# Patient Record
Sex: Female | Born: 1985 | Race: White | Marital: Married | Smoking: Never smoker
Health system: Northeastern US, Academic
[De-identification: ages and names within clinical notes are randomized; demographics above are authoritative.]

## PROBLEM LIST (undated history)

## (undated) DIAGNOSIS — F988 Other specified behavioral and emotional disorders with onset usually occurring in childhood and adolescence: Secondary | ICD-10-CM

## (undated) DIAGNOSIS — F419 Anxiety disorder, unspecified: Secondary | ICD-10-CM

## (undated) DIAGNOSIS — J45909 Unspecified asthma, uncomplicated: Secondary | ICD-10-CM

## (undated) HISTORY — DX: Other specified behavioral and emotional disorders with onset usually occurring in childhood and adolescence: F98.8

## (undated) HISTORY — DX: Anxiety disorder, unspecified: F41.9

## (undated) HISTORY — DX: Unspecified asthma, uncomplicated: J45.909

---

## 2009-02-28 ENCOUNTER — Ambulatory Visit
Admit: 2009-02-28 | Discharge: 2009-02-28 | Disposition: A | Discharge status: Home / Self Care | Payer: Self-pay | Source: Ambulatory Visit | Attending: Internal Medicine | Admitting: Internal Medicine

## 2009-03-02 LAB — AEROBIC CULTURE: Aerobic Culture: NO GROWTH

## 2010-07-18 ENCOUNTER — Encounter: Payer: Self-pay | Admitting: Gastroenterology

## 2010-07-18 ENCOUNTER — Encounter: Payer: Self-pay | Admitting: Dermatology

## 2010-07-18 ENCOUNTER — Ambulatory Visit: Payer: Self-pay | Admitting: Dermatology

## 2010-07-18 NOTE — Progress Notes (Addendum)
 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    She states that she has had a mole on her right upper chest for many years   but several months ago with half ripped off when she woke up in her sleep   and covered in blood.  It has grown back since that time and has been   extremely painful on a daily basis.  Her mother has a history of multiple   moles requiring removal but no history of melanoma.  She has no personal   history of skin cancer.     ROS:   Otherwise well.   --No other skin concerns      Past Medical/Surgical History:  Intake form reviewed; asthma, environmental   allergies, depression, wisdom teeth removal     Family History: No family history of melanoma; see history of present   illness; Intake form reviewed        Social History:    Intake form reviewed; she drink alcohol several times   per week.  She does not use recreational drugs.  She does not smoke.        Exam:    --Well-nourished, well-developed, no acute distress  --All of the following were examined and were within normal limits, except   as noted:     -- Face, Neck, Scalp:   -- Chest, Back, Abdomen:      -- Buttocks, Genitalia:     --Right/Left Arms/Hands:    --Right/Left Legs/Feet:   -- Hair, Nails, Oral Mucosa, Eyes:    -On the right chest, there is a 5 mm pink brown soft dome-shaped papule.    Scattered on the back and upper extremities there is an occasional evenly   pigmented, well-demarcated light to medium brown macule 2-4 mm in diameter.  Barriers to learning:  None      Assessment/Plan:   1.  Symptomatic dermal nevus  -She is reassured of benign nature.  Because it is significantly painful,   she would like definitive treatment.  -Punch biopsy of right chest.  Informed consent obtained.  1% lidocaine   with epinephrine used for anesthesia.  A 5 mm punch used.  3 sutures of 5-0   Prolene placed.  Dressed with antibiotic  ointment and a Band-Aid.  Wound   care discussed.  Return in 7 days for suture removal.  -We'll discuss biopsy results at suture removal.  Vital Signs   Recorded by [facility] on 18 Jul 2010 12:56 PM  Pain Scale: 2.5.  Allergies   Aspirin TABS  Ibuprofen IB TABS  Latex-asked/denied.  Attestation          I saw and evaluated the patient.  I agree with the resident's findings and   plan of care as documented above.  I was present for key and critical   portions of the procedure and immediately available  Wynonia Lawman, 07/25/2010,  8:31AM.  Signature   Electronically signed by: Elmon Else  MD - Res.; 07/18/2010 1:35 PM   EST.  Electronically signed by: Wynonia Lawman  M.D.; 07/25/2010 8:32 AM EST.

## 2010-07-18 NOTE — Miscellaneous (Unsigned)
 Continuity of Care Record  Created: todo  From: Retta Pitcher, Lavida Patch  From:   From: [employer], EHR v10.2.7.53  To: Iversen, Thereasa  Purpose: Patient Use;       Alerts  Allergy - Aspirin TABS   Allergy - Ibuprofen IB TABS   Allergy - Latex-asked/denied

## 2010-07-20 LAB — SURGICAL PATHOLOGY

## 2010-07-25 ENCOUNTER — Ambulatory Visit: Payer: Self-pay | Admitting: Dermatology

## 2010-07-25 ENCOUNTER — Encounter: Payer: Self-pay | Admitting: Dermatology

## 2010-08-08 ENCOUNTER — Ambulatory Visit: Payer: Self-pay | Admitting: Dermatology

## 2010-08-08 NOTE — Progress Notes (Signed)
 Progress Note   CC:  suture removal, rash  HPI:  the patient is a 25 year old Caucasian female who is here for removal   of sutures one week after biopsy of a lesion on the right chest.  She has   done well since the procedure.  She has developed a rash around the biopsy   site.     ROS:   Otherwise well.   --No other skin concerns    --New Skin Lesions/Rash   --see HPI    --Itching        Exam:    --Well-nourished, well-developed, no acute distress  --All of the following were examined and were within normal limits, except   as noted:     -- Face, Neck, Scalp:   -- Chest, Back, Abdomen:   on the right chest is a well approximated biopsy   site with Prolene sutures in place.  Distributed in a geometric   configuration surrounding the biopsy site is a red dermatitic thin plaque.      Barriers to learning:  None      Assessment/Plan:  1.  Allergic contact dermatitis to adhesive from the   bandage.  Diagnosis discussed.  Samples were provided of Topicort 0.25%   ointment twice daily use until clear.  2.  Suture removal status post biopsy of dermal nevus.  Pathology results   reviewed.  No further treatment is necessary.  Prolene sutures are removed.  3.  Return to clinic as needed.  Signature   Electronically signed by: Wynonia Lawman  M.D.; 08/08/2010 9:13 AM EST.

## 2010-08-22 ENCOUNTER — Ambulatory Visit
Admit: 2010-08-22 | Discharge: 2010-08-22 | Disposition: A | Discharge status: Home / Self Care | Payer: Self-pay | Source: Ambulatory Visit | Attending: Internal Medicine | Admitting: Internal Medicine

## 2010-08-23 LAB — VAGINITIS SCREEN: DNA PROBE: Vaginitis Screen:DNA Probe: 0

## 2010-08-24 LAB — AEROBIC CULTURE

## 2010-08-31 ENCOUNTER — Telehealth: Payer: Self-pay

## 2010-08-31 ENCOUNTER — Ambulatory Visit
Admit: 2010-08-31 | Discharge: 2010-08-31 | Disposition: A | Discharge status: Home / Self Care | Payer: Self-pay | Source: Ambulatory Visit | Attending: Internal Medicine | Admitting: Internal Medicine

## 2010-09-01 LAB — AEROBIC CULTURE: Aerobic Culture: 0

## 2010-10-11 ENCOUNTER — Ambulatory Visit
Admit: 2010-10-11 | Discharge: 2010-10-11 | Disposition: A | Discharge status: Home / Self Care | Payer: Self-pay | Source: Ambulatory Visit | Attending: Nurse Practitioner | Admitting: Nurse Practitioner

## 2010-10-13 LAB — HIV-1 AND 2 AB: HIV 1&2 Ab screen: NEGATIVE

## 2010-10-13 LAB — SYPHILIS SCREEN
Syphilis Screen: NEGATIVE
Syphilis Status: NONREACTIVE

## 2010-10-13 LAB — CHLAMYDIA PLASMID DNA AMPLIFICATION: Chlamydia Plasmid DNA Amplification: 0

## 2010-10-14 LAB — GYN CYTOLOGY

## 2010-10-15 LAB — GC CULTURE: GC Culture: 0

## 2010-12-16 ENCOUNTER — Telehealth: Payer: Self-pay

## 2010-12-16 NOTE — Telephone Encounter (Signed)
Gave to billing

## 2010-12-16 NOTE — Telephone Encounter (Signed)
Mrs. Body the patient's mother is requesting a callback.  She would like to speak with someone about a co-pay (99.00) for lab fees for a biopsy. The patient did not request this procedure and feel she should not be responsible for the bill. Mrs. Mleczko spoke with [facility] billing and they could not help her. Please call [phone]  Or  [phone].

## 2010-12-16 NOTE — Telephone Encounter (Signed)
Patient's mother Robin Johnston  is calling again.  She is very frustrated about this charge as the patient had been informed that she would only have been charged to remove the mole.  Robin Johnston requested for Dr. Marca Ancona to call [name] about this as she has been back and forth so much with our office and billing, she wants to speak with Dr. Marca Ancona directly.  Please call patient at [phone].

## 2010-12-20 NOTE — Telephone Encounter (Signed)
Returned patient's phone call today.  Sounds like she got a denial notice from her insurance company for the lab portion of the biopsy on her chest as being not medically necessary.  I explained that while removal of benign nevi is sometimes not medically necessary, in this case it is well-documented that her symptoms (pain, bleeding) will fulfill the criteria for medical necessity.  Advised her that she needs to file an appeal with her insurance company to cover those charges.

## 2012-02-08 ENCOUNTER — Ambulatory Visit
Admit: 2012-02-08 | Discharge: 2012-02-08 | Disposition: A | Discharge status: Home / Self Care | Payer: Self-pay | Source: Ambulatory Visit | Attending: Internal Medicine | Admitting: Internal Medicine

## 2012-02-08 LAB — COMPREHENSIVE METABOLIC PANEL
ALT: 20 U/L (ref 0–35)
AST: 28 U/L (ref 0–35)
Albumin: 4.7 g/dL (ref 3.5–5.2)
Alk Phos: 59 U/L (ref 35–105)
Anion Gap: 11 (ref 7–16)
Bilirubin,Total: 0.5 mg/dL (ref 0.0–1.2)
CO2: 26 mmol/L (ref 20–28)
Calcium: 9.1 mg/dL (ref 8.8–10.2)
Chloride: 105 mmol/L (ref 96–108)
Creatinine: 0.73 mg/dL (ref 0.51–0.95)
GFR,Black: 132 *
GFR,Caucasian: 115 *
Glucose: 71 mg/dL (ref 60–99)
Lab: 9 mg/dL (ref 6–20)
Potassium: 4.2 mmol/L (ref 3.3–5.1)
Sodium: 142 mmol/L (ref 133–145)
Total Protein: 6.7 g/dL (ref 6.3–7.7)

## 2012-02-08 LAB — CBC AND DIFFERENTIAL
Baso # K/uL: 0 10*3/uL (ref 0.0–0.1)
Basophil %: 0.4 % (ref 0.1–1.2)
Eos # K/uL: 0.5 10*3/uL — ABNORMAL HIGH (ref 0.0–0.4)
Eosinophil %: 6.8 % — ABNORMAL HIGH (ref 0.7–5.8)
Hematocrit: 40 % (ref 34–45)
Hemoglobin: 13.4 g/dL (ref 11.2–15.7)
Lymph # K/uL: 2.4 10*3/uL (ref 1.2–3.7)
Lymphocyte %: 33.6 % (ref 19.3–51.7)
MCV: 92 fL (ref 79–95)
Mono # K/uL: 0.6 10*3/uL (ref 0.2–0.9)
Monocyte %: 7.9 % (ref 4.7–12.5)
Neut # K/uL: 3.7 10*3/uL (ref 1.6–6.1)
Platelets: 173 10*3/uL (ref 160–370)
RBC: 4.3 MIL/uL (ref 3.9–5.2)
RDW: 13.9 % (ref 11.7–14.4)
Seg Neut %: 51.3 % (ref 34.0–71.1)
WBC: 7.2 10*3/uL (ref 4.0–10.0)

## 2012-02-08 LAB — SEDIMENTATION RATE, AUTOMATED: Sedimentation Rate: 7 mm/hr (ref 0–20)

## 2012-02-08 LAB — TSH: TSH: 1.02 u[IU]/mL (ref 0.27–4.20)

## 2012-02-09 LAB — TISSUE TRANSGLUT,IGA
IgA: 115 mg/dL (ref 70–400)
tTG,IgA: 3.6 AB/Units (ref 0.0–19.9)

## 2012-02-09 LAB — ANTI-SSA/SSB
Anti-LA/SS-B: <0.2 AI (ref 0.0–0.9)
Anti-RO/SS-A: <0.2 AI (ref 0.0–0.9)

## 2012-02-09 LAB — RHEUMATOID FACTOR,SCREEN: Rheumatoid Factor: <10 IU/mL

## 2012-02-09 LAB — ANTINUCLEAR ANTIBODY SCREEN: ANA Screen: NEGATIVE

## 2012-06-07 NOTE — Telephone Encounter (Signed)
Was told by my supervisor Diana to close all encounters that are over 6 months old.

## 2015-07-29 ENCOUNTER — Ambulatory Visit: Payer: Self-pay | Admitting: Family Medicine

## 2015-08-05 ENCOUNTER — Encounter: Payer: Self-pay | Admitting: Family Medicine

## 2015-08-05 ENCOUNTER — Ambulatory Visit: Payer: Self-pay | Admitting: Family Medicine

## 2015-08-05 ENCOUNTER — Other Ambulatory Visit
Admission: RE | Admit: 2015-08-05 | Discharge: 2015-08-05 | Disposition: A | Discharge status: Home / Self Care | Payer: Self-pay | Source: Ambulatory Visit | Attending: Family Medicine | Admitting: Family Medicine

## 2015-08-05 VITALS — BP 98/59 | Ht 69.75 in | Wt 158.0 lb

## 2015-08-05 DIAGNOSIS — Z30432 Encounter for removal of intrauterine contraceptive device: Secondary | ICD-10-CM

## 2015-08-05 DIAGNOSIS — Z113 Encounter for screening for infections with a predominantly sexual mode of transmission: Secondary | ICD-10-CM

## 2015-08-05 DIAGNOSIS — Z3009 Encounter for other general counseling and advice on contraception: Secondary | ICD-10-CM

## 2015-08-05 LAB — POCT URINE PREGNANCY: Lot #: 162941

## 2015-08-05 NOTE — Progress Notes (Signed)
Subjective:     Robin Johnston is a 30 y.o. female  who presents for first appointment to [facility]. she  wishes to discuss contraceptive options. Other acute concerns include - denies.  The patient is sexually active with her husband. Pertinent past medical history entered in the chart.  Contraceptive history: migraines. No past STD history.     Current contraception: IUD  Side effects: no, likes it and want another one  Condom [id number] currently since Mirena is expired  Plan B: has used before  Past Contraception: OCP (estrogen/progesterone) and IUD  Reason for discontinuation:  ParaGard caused heavy bleeding, Stopped Yaz to get the IUD    Menstrual History:  OB History     Gravida Para Term Preterm AB TAB SAB Ectopic Multiple Living            Menarche age: 58  Patient's last menstrual period was 07/26/2015 (exact date).       Patient's medications, allergies, past medical, surgical, social and family histories were reviewed and updated as appropriate.    Review of Systems  Health history intake form reviewed with patient in detail.    Pertinent items are noted in HPI.  Immunizations reviewed Yes  Patient has advanced directives in place: no  Discussed the following with client:  Domestic violence: denies   Partner Coercion: denies  Client lives with: Husband .    Objective:     Visit Vitals    BP 98/59 (BP Location: Left arm, Patient Position: Sitting, Cuff Size: adult)    Ht 1.772 m (5' 9.75")    Wt 71.7 kg (158 lb)    LMP 07/26/2015 (Exact Date)    BMI 22.83 kg/m2      General appearance: alert, appears stated age and no distress  Head: Normocephalic, without obvious abnormality, atraumatic  Pelvic: cervix normal in appearance, external genitalia normal, no adnexal masses or tenderness, no cervical motion tenderness, uterus normal size, shape, and consistency and vagina normal without discharge  Extremities: extremities normal, atraumatic, no cyanosis or edema  + mood, +  affect    Assessment:     Contraception counseling  STD screening    Plan:     All questions answered.  Chlamydia specimen.  Contraception: condoms: 100% of the time and returning for [facility] insertion - unable to place it today. Could not sound the uterus after removal of her Mirena..  Educational material distributed.  Follow up in 2 weeks.  GC specimen.  Pregnancy test, result: negative.  Vaginitis swab sent  Verbal and written information provided to the patient today regarding Her chosen form of contraception.   Contraception consent signed and placed in chart: yes  Consent for routine health care signed:  yes  Notification of privacy signed and given for scanning into the chart:  yes  Condoms dispensed: declined  Plan B dispensed:declined  Immunizations reviewed Yes  PCP Referral not indicated   Patient given Advanced Directives packet: yes  Follow up in 2 weeks     IUD Removal Note    Robin Johnston is a 30 y.o. female, G0P0000 with a last period of 07/26/2015.    Her current method of birth control is Mirena and prior methods include: OCP (estrogen/progesterone).     Patient requests IUD removal due to expiration of the device.    UPT result: negative     Consent:  The procedure risks, benefits, complications and possible alternatives were discussed with  the patient. Patient understands that she may become pregnant after removal. All questions were answered prior to the patient signing the informed consent.    Pre-Procedural Time Out:  08/05/2015                                         4:26 PM    Correct Procedure: Yes  Correct Patient: (use 2 Identifiers) Yes  Correct Site: Yes  Site marked: Yes  Correct Patient Position: Yes  Consent Verified: Yes  Appropriate Hand Hygiene Used: Yes  List Any Participants Involved in Time-Out : patient, Consuella LoseAllison Hartfiel, LPN, Otilio JeffersonBETHANY L MERKLINGER, NP  Availability of correct implants and any special equipment: Yes    Procedure Details       The patient was placed in the  dorsal lithotomy position.  Bimanual exam showed the uterus to be in  a retroverted position.  A speculum was inserted into the vagina, and the IUD strings were visualized and a sponge forceps was used to grasp stings.  The IUD was removed and noted to be intact on inspection.  Bleeding was not noted.  The patient tolerated the procedure well. Blood loss was minimal.    Post procedure pain rated as moderate cramping       Assessment:   Mirena IUD removal at patient request.     Plan:  Post removal instructions were reviewed with the patient and written information was also provided.    The plan was reviewed with the patient including:  - Monitoring for heavy bleeding, fever or severe pain.  - The patient will follow up in 2 weeks for a Kyleena placement - unable to pass sound today - will premedicate with Misoprostol   - Post IUD removal patient will use condoms: 100% of the time for contraception. Returning for [employer].       All questions were answered and the patient stated a good understanding of instructions.

## 2015-08-06 LAB — VAGINITIS SCREEN: DNA PROBE: Vaginitis Screen:DNA Probe: 0

## 2015-08-06 LAB — N. GONORRHOEAE DNA AMPLIFICATION: N. gonorrhoeae DNA Amplification: 0

## 2015-08-06 LAB — CHLAMYDIA PLASMID DNA AMPLIFICATION: Chlamydia Plasmid DNA Amplification: 0

## 2015-08-16 ENCOUNTER — Ambulatory Visit: Payer: Medicaid (Managed Care) | Admitting: Family Medicine

## 2015-08-22 ENCOUNTER — Encounter: Payer: Self-pay | Admitting: Family Medicine

## 2015-09-01 ENCOUNTER — Other Ambulatory Visit: Payer: Self-pay | Admitting: Family Medicine

## 2015-09-01 MED ORDER — MISOPROSTOL 200 MCG PO TABS *I*
ORAL_TABLET | ORAL | 0 refills | Status: DC
Start: 2015-09-01 — End: 2015-09-02

## 2015-09-02 ENCOUNTER — Ambulatory Visit: Payer: Medicaid (Managed Care) | Attending: Family Medicine | Admitting: Family Medicine

## 2015-09-02 ENCOUNTER — Encounter: Payer: Self-pay | Admitting: Family Medicine

## 2015-09-02 VITALS — BP 109/68 | Wt 158.0 lb

## 2015-09-02 DIAGNOSIS — Z538 Procedure and treatment not carried out for other reasons: Secondary | ICD-10-CM

## 2015-09-02 LAB — POCT URINE PREGNANCY: Lot #: 170601

## 2015-09-02 NOTE — Progress Notes (Signed)
IUD Insertion Note    Robin Johnston is a 30 y.o. female, G0P0000 with a last period of 07/26/2015. She took Misoprostol 400 MCG buccally 2 hours prior to visit.     Her current method of birth control is abstinence and prior methods include: condoms: 50% of the time and IUD: Mirena. She states her last intercourse was prior to Mirena removal.  Patient now requests insertion of Kyleena IUD for contraception.    GC/CT done on 08/05/2015  Results: GC: negative, CT: negative    UPT result: Negative    Consent:  The procedure risks, benefits, complications and possible alternatives were discussed with the patient. All questions were answered prior to the patient signing the informed consent.    Pre-Procedural Time Out:  09/02/2015                                         9:33 AM    Correct Procedure: Yes  Correct Patient: (use 2 Identifiers) Yes  Correct Site: Yes  Site marked: N/A  Correct Patient Position: Yes  Appropriate Hand Hygiene Used: Yes  List Any Participants Involved in Time-Out: patient, Robin LoseAllison Hartfiel, LPN, Otilio JeffersonBETHANY L MERKLINGER, NP  Availability of correct implants and any special equipment: Yes    Procedure Details     The patient was placed in the dorsal lithotomy position.  Bimanual exam showed the uterus to be in  a retroverted position.  A speculum was inserted into the vagina, and the cervix prepped with povidone iodine.  A single tooth tenaculum was placed on the anterior lip of the cervix at 11 & 1o'clock. The uterus was unable to be sounded due to inability to get through the internal OS. The tenaculum was removed without incident. The patient tolerated the procedure well. Blood loss was minimal.        Assessment:   Failed IUD insertion    Plan:  Patient referred to GYN provider that can due cervical dilation for IUD insertion.     All questions were answered and the patient stated a good understanding of instructions.

## 2016-02-09 ENCOUNTER — Other Ambulatory Visit: Payer: Self-pay | Admitting: Family Medicine

## 2016-02-10 ENCOUNTER — Other Ambulatory Visit: Payer: Self-pay | Admitting: Gastroenterology

## 2016-02-10 ENCOUNTER — Inpatient Hospital Stay: Admit: 2016-02-10 | Discharge: 2016-02-10 | Disposition: A | Discharge status: Home / Self Care | Payer: Self-pay

## 2016-03-29 ENCOUNTER — Encounter: Payer: Self-pay | Admitting: Physical Medicine and Rehabilitation

## 2016-03-29 ENCOUNTER — Ambulatory Visit: Payer: Self-pay | Admitting: Physical Medicine and Rehabilitation

## 2016-03-29 VITALS — BP 113/64 | Ht 69.75 in | Wt 150.0 lb

## 2016-03-29 DIAGNOSIS — M79601 Pain in right arm: Secondary | ICD-10-CM

## 2016-03-29 DIAGNOSIS — M542 Cervicalgia: Secondary | ICD-10-CM
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
## 2016-03-30 ENCOUNTER — Other Ambulatory Visit: Payer: Self-pay

## 2016-04-18 ENCOUNTER — Encounter: Payer: Self-pay | Admitting: Physical Medicine and Rehabilitation

## 2016-04-18 ENCOUNTER — Ambulatory Visit: Payer: Self-pay | Admitting: Physical Medicine and Rehabilitation

## 2016-04-18 VITALS — BP 138/78 | HR 96 | Ht 69.75 in | Wt 163.0 lb

## 2016-04-18 DIAGNOSIS — M542 Cervicalgia: Secondary | ICD-10-CM

## 2016-04-18 DIAGNOSIS — S161XXD Strain of muscle, fascia and tendon at neck level, subsequent encounter: Secondary | ICD-10-CM
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
## 2016-05-01 ENCOUNTER — Ambulatory Visit: Payer: Self-pay | Admitting: Rehabilitative and Restorative Service Providers"

## 2016-05-02 ENCOUNTER — Telehealth: Payer: Self-pay

## 2016-05-02 NOTE — Telephone Encounter (Signed)
CALLED PATIENT AND RESCHEDULED MISSED PT APPOINTMENT.

## 2016-05-03 ENCOUNTER — Ambulatory Visit: Payer: Self-pay

## 2016-05-03 DIAGNOSIS — M25311 Other instability, right shoulder: Secondary | ICD-10-CM

## 2016-05-03 DIAGNOSIS — S161XXD Strain of muscle, fascia and tendon at neck level, subsequent encounter: Secondary | ICD-10-CM
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
## 2016-05-12 ENCOUNTER — Ambulatory Visit: Payer: Self-pay

## 2016-05-15 ENCOUNTER — Encounter: Payer: Self-pay | Admitting: Orthopedic Surgery

## 2016-05-15 ENCOUNTER — Ambulatory Visit: Payer: Self-pay | Admitting: Orthopedic Surgery

## 2016-05-19 ENCOUNTER — Ambulatory Visit: Payer: Self-pay

## 2016-05-19 DIAGNOSIS — S161XXD Strain of muscle, fascia and tendon at neck level, subsequent encounter: Secondary | ICD-10-CM

## 2016-05-19 DIAGNOSIS — M25311 Other instability, right shoulder: Secondary | ICD-10-CM
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
## 2016-05-19 NOTE — Progress Notes (Signed)
SPORTS PT CHARGE:   Supervised Modalities:  PT CODE 200 - CPT CODE PT - 97010 - Hot / Cold Packs  Therapeutic Exercises:  PT Code 314 - CPT Code PT - 97110 - Therapeutic Exercise (23 - 37 min) x 2 units  Total Minute Time:  45 min

## 2016-05-26 ENCOUNTER — Ambulatory Visit: Payer: Self-pay

## 2016-05-26 DIAGNOSIS — S161XXD Strain of muscle, fascia and tendon at neck level, subsequent encounter: Secondary | ICD-10-CM

## 2016-05-26 DIAGNOSIS — M25311 Other instability, right shoulder: Secondary | ICD-10-CM
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
## 2016-05-27 NOTE — Progress Notes (Signed)
SPORTS PT CHARGE:   Supervised Modalities:  PT CODE 200 - CPT CODE PT - 97010 - Hot / Cold Packs  Therapeutic Exercises:  PT Code 314 - CPT Code PT - 97110 - Therapeutic Exercise (23 - 37 min) x 2 units  Total Minute Time:  45 min

## 2016-06-01 ENCOUNTER — Ambulatory Visit: Payer: Self-pay

## 2016-06-01 DIAGNOSIS — M25311 Other instability, right shoulder: Secondary | ICD-10-CM
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
## 2016-06-08 ENCOUNTER — Ambulatory Visit: Payer: Self-pay

## 2016-06-08 DIAGNOSIS — M25311 Other instability, right shoulder: Secondary | ICD-10-CM
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
## 2016-06-15 ENCOUNTER — Ambulatory Visit: Payer: Auto Insurance (includes no fault)

## 2016-06-22 ENCOUNTER — Ambulatory Visit: Payer: Auto Insurance (includes no fault) | Attending: Physical Medicine and Rehabilitation

## 2016-06-22 DIAGNOSIS — M25311 Other instability, right shoulder: Secondary | ICD-10-CM
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
## 2016-06-29 ENCOUNTER — Ambulatory Visit: Payer: Auto Insurance (includes no fault)

## 2016-06-29 DIAGNOSIS — M25311 Other instability, right shoulder: Secondary | ICD-10-CM
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
## 2016-07-13 ENCOUNTER — Ambulatory Visit: Payer: Auto Insurance (includes no fault) | Attending: Physical Medicine and Rehabilitation

## 2016-07-13 DIAGNOSIS — M25311 Other instability, right shoulder: Secondary | ICD-10-CM | POA: Insufficient documentation
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
## 2016-07-18 ENCOUNTER — Ambulatory Visit: Payer: Auto Insurance (includes no fault) | Admitting: Orthopedic Surgery

## 2016-07-18 ENCOUNTER — Ambulatory Visit
Admission: RE | Admit: 2016-07-18 | Discharge: 2016-07-18 | Disposition: A | Discharge status: Home / Self Care | Payer: Auto Insurance (includes no fault) | Source: Ambulatory Visit | Attending: Orthopedic Surgery | Admitting: Orthopedic Surgery

## 2016-07-18 VITALS — BP 101/50 | Ht 70.0 in | Wt 163.0 lb

## 2016-07-18 DIAGNOSIS — M7521 Bicipital tendinitis, right shoulder: Secondary | ICD-10-CM

## 2016-07-18 DIAGNOSIS — M25511 Pain in right shoulder: Secondary | ICD-10-CM | POA: Insufficient documentation

## 2016-07-18 MED ORDER — LIDOCAINE HCL 1 % IJ SOLN *I*
5.0000 mL | Freq: Once | INTRAMUSCULAR | Status: AC | PRN
Start: 2016-07-18 — End: 2016-07-18
  Administered 2016-07-18: 5 mL via INTRA_ARTICULAR

## 2016-07-18 MED ORDER — METHYLPREDNISOLONE ACETATE 40 MG/ML IJ SUSP *I*
40.0000 mg | Freq: Once | INTRAMUSCULAR | Status: AC | PRN
Start: 2016-07-18 — End: 2016-07-18
  Administered 2016-07-18: 10:00:00 40 mg via INTRA_ARTICULAR
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
## 2016-07-18 NOTE — Procedures (Signed)
Large Joint Aspiration/Injection Procedure    Date/Time: 07/18/2016 9:53 AM  Consent given by: patient  Site marked: site marked  Timeout: Immediately prior to procedure a time out was called to verify the correct patient, procedure, equipment, support staff and site/side marked as required     Procedure Details    Location: shoulder - R biceps  Preparation: The site was prepped using the usual aseptic technique.  Anesthetics administered: 5 mL lidocaine 1 %  Intra-Articular Steroids administered: 40 mg methylPREDNISolone acetate 40 MG/ML  Dressing:  A dry, sterile dressing was applied.  Patient tolerance: patient tolerated the procedure well with no immediate complications

## 2016-08-08 ENCOUNTER — Ambulatory Visit: Payer: Auto Insurance (includes no fault) | Attending: Orthopedic Surgery | Admitting: Orthopedic Surgery

## 2016-08-08 ENCOUNTER — Encounter: Payer: Self-pay | Admitting: Orthopedic Surgery

## 2016-08-08 VITALS — BP 122/70 | HR 82 | Ht 70.0 in | Wt 163.0 lb

## 2016-08-08 DIAGNOSIS — M7521 Bicipital tendinitis, right shoulder: Secondary | ICD-10-CM
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
## 2016-08-08 NOTE — Addendum Note (Signed)
Addended by: Ernesta Amble on: 08/08/2016 01:52 PM     Modules accepted: Orders

## 2016-08-14 ENCOUNTER — Telehealth: Payer: Self-pay | Admitting: Orthopedic Surgery

## 2016-08-14 NOTE — Telephone Encounter (Signed)
Received the following message from the prior auth team, how would you like to proceed?..........................................................     Follow up is needed with [facility]. They are offering to approve an alternate exam. Please advise.   Clinicals have been faxed to [facility] for case# [id number]     DR. WARREN HAMMERT   [address]   [place], [place] [address]   Case Number: [id number]   Member Name: Robin Johnston   Member Number: [id number]   Procedure: CPT 73223 - Magnetic resonance imaging (MRI), a special kind of   picture of your arm joint (wrist, elbow, or shoulder) without and with   contrast (dye)   Referring Physician: DR. WARREN HAMMERT   NOTE: If you have already provided the additional clinical information, please disregard   this fax.   [facility] ([facility]), on behalf of [facility], reviews requests for coverage for   certain services for medical necessity and appropriateness on behalf of its members. This letter   is in response to your request for pre-certification of the above referenced procedure. This   request was reviewed by a physician advisor.   In order to complete a medical necessity review on your request, the following information is   required: You have requested a 73223 MRI upper extremity joint with and without contrast. The   information provided meets clinical guidelines for either a 73221 MRI upper extremity joint   without contrast or 73222 MR arthrogram (with contrast). Please notify us by phone or fax of   your decision to accept or decline the test offered. Please provide the above along with current   clinical office notes so that the medical necessity review process can be completed.   If [facility] does not receive this information by 08/19/2016, this will result in an automatic denial   of this request for lack of clinical information.   [facility]   [address] [place] [address]   Fax: [phone]   This notice is available in other  formats for members with special needs or who speak   languages other than English. You may call [employer] at [phone] Monday through Friday   7:00 a.m. to 7:00 p.m. TTY users may call [phone] to reach [facility] Member   Services or Medical Management Department.   [facility]

## 2016-08-15 ENCOUNTER — Telehealth: Payer: Self-pay | Admitting: Orthopedic Surgery

## 2016-08-16 NOTE — Telephone Encounter (Signed)
Received the following message from the prior auth team, how would you like to proceed?..........................................................    Follow up is needed with [facility]. They are offering to approve an alternate exam. Please advise.   [facility] [id number]     DR. Ruben ImWARREN Johnston   [address]   [place], [place] [id number]   Case Number: [id number]   Member Name: Robin Johnston   Member Number: [id number]   Procedure: CPT 7425973223 - Magnetic resonance imaging (MRI), a special kind of   picture of your arm joint (wrist, elbow, or shoulder) without and with   contrast (dye)   Referring Physician: DR. Broadus JohnWARREN Johnston   NOTE: If you have already provided the additional clinical information, please disregard   this fax.   [facility] ([employer]), on behalf of [facility], reviews requests for coverage for   certain services for medical necessity and appropriateness on behalf of its members. This letter   is in response to your request for pre-certification of the above referenced procedure. This   request was reviewed by a physician advisor.   In order to complete a medical necessity review on your request, the following information is   required: You have requested a 73223 MRI upper extremity joint with and without contrast. The   information provided meets clinical guidelines for either a 73221 MRI upper extremity joint   without contrast or [id number] MR arthrogram (with contrast). Please notify us by phone or fax of   your decision to accept or decline the test offered. Please provide the above along with current   clinical office notes so that the medical necessity review process can be completed.   If [facility] does not receive this information by 08/19/2016, this will result in an automatic denial   of this request for lack of clinical information.   [facility]   [address]   [place], [place] [id number]   Fax: [phone]   This notice is available in other  formats for members with special needs or who speak   languages other than English. You may call [facility] at [phone] Monday through Friday   7:00 a.m. to 7:00 p.m. TTY users may call [phone] to reach [facility] Member   Services or Medical Management Department.   [facility]

## 2016-08-17 ENCOUNTER — Ambulatory Visit
Admission: RE | Admit: 2016-08-17 | Discharge: 2016-08-17 | Disposition: A | Discharge status: Home / Self Care | Payer: Auto Insurance (includes no fault) | Source: Ambulatory Visit

## 2016-08-17 ENCOUNTER — Other Ambulatory Visit: Payer: Self-pay

## 2016-08-17 DIAGNOSIS — M7521 Bicipital tendinitis, right shoulder: Secondary | ICD-10-CM

## 2016-08-17 NOTE — Addendum Note (Signed)
Addended by: Ernesta AmbleIRISIO, Jennifermarie Franzen on: 08/17/2016 11:49 AM     Modules accepted: Orders

## 2016-08-17 NOTE — Telephone Encounter (Signed)
Spoke w/ Mr Dirisio, he states that someone for peer to peer will be calling our office @ 8:15 AM on 08/18/16.  He is asking that we send the call up to him when they call.

## 2016-08-17 NOTE — Telephone Encounter (Signed)
Spoke w/ Robin Johnston, he states that someone for peer to peer will be calling our office @ 8:15 AM on 08/18/16.  He is asking that we send the call up to him when they call.

## 2016-08-18 NOTE — Telephone Encounter (Signed)
[facility] called back this morning to speak to Robin Johnston.  It was decided that the MRI would be changed to an Arthrogram.  The older order is discontinued and the new order will need to be authorized.

## 2016-08-18 NOTE — Telephone Encounter (Signed)
[facility] called back this morning to speak to Robin Johnston.  It was decided that the MRI would be changed to an Arthrogram.  The older order is discontinued and the new order will need to be authorized.

## 2016-08-18 NOTE — Telephone Encounter (Signed)
Arthrogram scheduled 08/30/16

## 2016-08-18 NOTE — Telephone Encounter (Signed)
Arthrogram scheduled 08/30/16

## 2016-08-30 ENCOUNTER — Other Ambulatory Visit: Payer: Auto Insurance (includes no fault)

## 2016-09-15 ENCOUNTER — Ambulatory Visit: Payer: Auto Insurance (includes no fault) | Admitting: Orthopedic Surgery

## 2020-08-03 ENCOUNTER — Other Ambulatory Visit: Payer: Self-pay | Admitting: Neurology

## 2020-08-03 DIAGNOSIS — G2582 Stiff-man syndrome: Secondary | ICD-10-CM

## 2020-08-10 ENCOUNTER — Ambulatory Visit
Admission: RE | Admit: 2020-08-10 | Discharge: 2020-08-10 | Disposition: A | Discharge status: Home / Self Care | Payer: 59 | Source: Ambulatory Visit | Attending: Neurology | Admitting: Neurology

## 2020-08-10 ENCOUNTER — Other Ambulatory Visit: Payer: Self-pay

## 2020-08-10 DIAGNOSIS — G2582 Stiff-man syndrome: Secondary | ICD-10-CM | POA: Diagnosis present

## 2020-08-10 MED ORDER — GADOBUTROL 1 MMOL/ML IV SOLN
8.0000 mL | Freq: Once | INTRAVENOUS | Status: AC | PRN
  Administered 2020-08-10: 8 mL via INTRAVENOUS

## 2020-10-26 ENCOUNTER — Ambulatory Visit: Payer: 59 | Admitting: Physical Therapy

## 2020-10-28 ENCOUNTER — Ambulatory Visit: Payer: 59 | Admitting: Physical Therapy

## 2020-11-01 ENCOUNTER — Encounter: Payer: 59 | Admitting: Physical Therapy

## 2020-11-01 ENCOUNTER — Ambulatory Visit: Payer: 59 | Admitting: Physical Therapy

## 2020-11-03 ENCOUNTER — Ambulatory Visit: Payer: 59 | Admitting: Physical Therapy

## 2020-11-05 NOTE — Progress Notes (Deleted)
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  FINDINGS:  Bone marrow signal intensity is normal. The visualized cord is unremarkable and the conus medullaris ends at a normal level. There is no abnormal enhancement.  The vertebral bodies are normally aligned. Disc spaces are preserved. No significant spinal canal or neural foraminal narrowing.  The paraspinal tissues are within normal limits.  For the purposes of this dictation, the lowest well formed intervertebral disc space is assumed to be the L5-S1 level, and there are presumed to be five lumbar-type vertebral bodies.  ////////////////////////////  NEUROMUSCULAR PT EVAL  REFERRING PROVIDER:   REASON FOR REFERRAL:   TREATMENT TIME:   LEVEL OF COMPLEXITY: Veteran is a MODERATE COMPLEXITY patient.  - Expanded clinical history: comorbidities require minimal to moderate      modification of tasks to complete evaluation  - Detailed assessment identifying 3-5 performance deficits  - Clinical decision making of moderate complexity, several treatment      options Burns [place] is a HIGH COMPLEXITY patient.  - Expanded clinical history: comorbidities require significant      modification of tasks to complete evaluation  - Detailed assessment identifying 5 or more performance deficits  - Clinical decision making of high complexity, several treatment      options  SUBJECTIVE:  PAIN ASSESSMENT:  FALLS:    Number of falls in the last 6 months:     Reason for falls:     Location of falls:     Injuries endured:     Able to get up from fall Independently?:  SELF-REPORTED MEASURES:  DHI:  ABC:   Older Adults: < 50 indicates low confidence, >50 to 80 indicates moderate confidence, > 80 indicates high confidence PD: >80  indicates reduced fall risk   PARKINSON'S SPECIFIC: PFS-16: (>2.95 = experiences fatigue, >3.30 = perceives fatigue as a problem)  FOGQ:   SOCIAL HISTORY:   CURRENT LEVEL OF FUNCTION:   CURRENT HOME EXERCISE PROGRAM:  PATIENT'S GOAL(S):  IMAGING:  =========================================================== OBJECTIVE: Veteran ambulates into PT clinic with ()AD, () no AD.  Veteran (x) well-groomed, appropriately dressed, pleasant, and cooperative.  No acute distress noted.  COGNITION:    - Veteran alert and oriented to person, place, and situation.    - Able to follow:         () simple commands, 1-2 step commands         () multi-step commands, 2+ commands, and verbalize needs    - 3 Word Memory Test: Apple, Table, Penny__  /3  CN:    I: Deferred    II,III: PERRLA, VFF by confrontation. No evidence of visual field deficit,  monocular or binocular   III,IV,VI: EOMI w/o nystagmus, no ptosis. Pursuit- (Smooth) and Saccades - (No dysmetria)   V: Sensation intact to LT   VII: Face symmetric without weakness   VIII: Hearing to finger rub intact    IX,X: Voice normal, palate elevates symmetrically   XI: SCM/trap 5/5   XII: Tongue protrudes midline, no atrophy or fasciculation  MUSCLE TONE/SPASTICITY: (Modified Ashworth Scale) '[ ]' Flaccid '[ ]' 0 No increase in muscle tone '[ ]' 1 Slight increase in muscle tone, minimal resistance at end range '[ ]' 1+ Slight increase in muscle tone, minimal resistance throughout range '[ ]' 2 Moderate increase in muscle tone, affected parts easily moved '[ ]'   3 Considerable increase in muscle tone '[ ]' 4 Affected parts rigid in flexion or extension    MOTOR: Normal bulk and tone, no tremor, rigidity or bradykinesia, no  pronator drift.   ROM:  STRENGTH:   COORDINATION: FNF: RAMs: '[]'  WNL     '[]'  Rapid movements are slow, with moderately decreased amplitude, but regular. '[]'  Dysdiadochokinesia   HTS:   OCULOMOTOR    Pursuits:     Saccades:    Vergence:  SENSATION: Temperature: Vibration: Monofilament: Proprioception:   DTR   Patellar:  Achilles:     BALANCE  Response to External perturbations:          Romberg Test: EO___ EC___   4 Stage Balance Test  1. stand with feet side by side:          2. semi-tandem (instep to big toe):       3. tandem stance (heel to toe):           4. stand on one foot:                    - an older adult who cannot hold tandem stance for at least 10s is at increased  risk for falling      mCTSIB:   Firm/Eyes Open:   Firm/Eyes Closed:   Foam/Eyes Open:   Foam/Eyes Closed:   Mini BESTest (measured at _/28pts on __) Anticipatory Balance         1. Sit to stand:        /2         2. Rise to toes:        /2         3. Stand on 1 leg L/R:  /2 Reactive Postural Control - Compensatory Stepping Correction         4. Forward:             /2         5. Backward:            /2         6. Left/Right:          /2 Sensory Orientation         7. Romberg, EO, firm:   /2         8. Romberg, EC, foam:   /2         9. Incline, EC:         /2 Dynamic Gait         10. Change in gait speed:/2         11. Horiz. head turns:  /2         12. Pivot turns:        /2         13. Step over object:   /2         14. TUG / Dual task:    /2 Total: /28  BERG BALANCE TEST:   (<45/56 = Fall Risk; 41-45=Low, 21-40=Moderate, 0-20= High)   FUNCTIONAL OUTCOME MEASURES:   TUG:  (>= 13.5 secs = FALL RISK)     30 SEC CHAIR STAND:    Average for Age =  - A below average score indicates an increased FALL RISK, LE weakness         - 81-35 year old female <14 reps, female <12 reps         - 10-34 year old female <12 reps, female <11 reps         -  31-34 year old female <12 reps, female <10 reps         - 27-34 year old female <11 reps, female <10 reps         - 30-34 year old female <10 reps, female <9 reps         - 32-34 year old female <8 reps, female <8 reps         - 89-34 year old female <7 reps,  female <4 reps   5x SIT TO STAND:          , performed without UE support - cut-off score in predicting recurrent fallers: 15s (sensitivity 55%,   specificity 65%)   GAIT ASSESSMENT  Observation:   Gait Speed: __m/s, measured with (AD)  - <.22ms indicates severe impairment, household ambulator, dependence with ADLs,  and increased risk for hospitalization. - >.68m and <1.35m12mindicates limited ability to walk in the community, may be  at increased risk for falls - >1.35m/26mndicates community ambulator, decreased risk for hospitalization,  increased IND with self-care, and less likely to have other adverse events  MS SPECIFIC GAIT SPEED: 25 foot walk test:_______  2 Minute Walk Test (2MWT): __ft (__m), measured with (AD) (measured at __ft on __ with (AD)) - average 2MWT distance for retirement dwelling older adults: 492ft72f35m) 45merage 2MWT distance for long term care residents: 254ft (58fm) - M45mfor older adults: 40ft (1275f - Mea79mistance in Meters by Age & Gender  Age           Female        Female  50-59 yrs   190 m  169 m 60-69 yrs 67-6983 m      163 m 70-79 yrs     163 m       150 m  DGI: (<19/24 = FALL RISK)   P.T. INTERVENTION: Patient educated in and performed the following, which are prescribed as HEP:     PATIENT EDUCATION: Educated on importance of repetition, intensity and saliency for neurplasticity  and provided examples of changes to functional activities to improve this. PT provided dialogue/lecture and handouts regarding findings,treatment/HEP. Patient expresses understanding through reteach and/or return demonstration.  ===========================================================  ASSESSMENT Veteran presents with impairments in.... Activity limitations with.... Participation restrictions for household tasks and community level activities such as safely navigating his home  environment without supervision, safely navigating dynamic and  unfamiliar environments, attending medical appointments, participating in  family and recreational activiites.  Veteran will benefit from skilled PT to address the above functional deficits, fall risk mitigation and improved quality of life.   GOALS 1. Veteran to express understanding of diagnosis and rationale for treatment.   GOAL MET 2. Veteran and/or caregiver to demonstrate understanding of HEP and the  importance of compliance for recovery. GOAL MET  3. Veteran to be independent with HEP as needed to address the above problems  5/7 days. (2 weeks) 4. Veteran to score _____ on ______ as evidence of improved functional  mobility and safety. (12 weeks)   =============================================================================================== PLAN-veteran participated in and agrees to following:  Veteran to return to clinic in 2-4 weeks for follow up.     OR Veteran referred to care in the community for continued PT in order to better address above problems and goals. Patient will return for re-evaluation as needed at the end of Care  in the Community treatment.   //////////////////////////    Ankle ROM:  R                 L              NORMS:                 AROM   PROM       AROM   PROM DF:                                                     20 PF:                                                     50 Inversion:                                              35 Eversion:                                               15  Ankle Strength:      R        L DF:                 5/5      5/5 PF:                 5/5      5/5 Inversion:          5/5      5/5 Eversion:           5/5      5/5  Dermatomes (intact/diminished/absent):    R           L -Upper anterior thigh (L1) -Mid anterior thigh (L2) -Medial femoral condyle (L3) -Medial malleolus (L4) -Dorsum 3rd MTP joint (L5) -Lateral heel (S1) -Popliteal fossa (S2)  -Ischial tuberosity (S3) -Perineal area  (S4-5)  Myotomes:                         R        L -Hip flexion (L1-2)              5/5      5/5 -Knee extension (L3-4)           5/5      5/5 -Heel walking (L4-5)             5/5      5/5 -Great toe extension (L5)        5/5      5/5 -Single leg stance (L5-S1)       5/5      5/5 -Toe walking (S1)                5/5      5/5  Reflexes:  Patellar 2+ Achilles 2+  Special tests:   - Achilles tendinopathy:  Palpation for tenderness                    (+/-)  [facility] test                  (+/-) Single leg heel raise                       (+/-)  - Ligamentous laxity:  Lateral ligaments: Anterior drawer                       (+/-) Talar tilt (varus) stress test        (+/-) Medial subtalar glide                 (+/-) Medial ligaments Talar tilt (valgus) stress test       (+/-) Medial tenderness                     (+/-) Syndesmotic integrity Fibular translation test              (+/-) Syndesmosis squeeze test              (+/-)  - Ankle Impingement (clinical prediction rule) Anterolateral joint tenderness              (+/-) Anterolateral ankle joint swelling          (+/-) Pain with forced DF                         (+/-) Pain with single leg squat on affected side (+/-) Pain with activities                        (+/-) Absence of ankle instability                (+/-)  - Tarsal Tunnel Syndrome Tinel's sign                                (+/-) Triple compression stress test              (+/-)  Functional: Squat Heel raise  Heel walking Toe walking

## 2020-11-08 ENCOUNTER — Ambulatory Visit: Payer: 59 | Admitting: Physical Therapy

## 2020-11-08 ENCOUNTER — Telehealth: Payer: Self-pay | Admitting: Physical Therapy

## 2020-11-08 ENCOUNTER — Ambulatory Visit: Payer: 59 | Attending: Neurology | Admitting: Physical Therapy

## 2020-11-08 NOTE — Telephone Encounter (Signed)
Called pt to inquiry about absence and left VM requesting she call office to check in and reschedule.

## 2020-11-10 ENCOUNTER — Ambulatory Visit: Payer: 59 | Admitting: Physical Therapy

## 2020-11-23 ENCOUNTER — Encounter: Payer: 59 | Admitting: Physical Therapy

## 2020-11-25 ENCOUNTER — Encounter: Payer: 59 | Admitting: Physical Therapy

## 2020-11-29 ENCOUNTER — Encounter: Payer: 59 | Admitting: Physical Therapy

## 2020-12-01 ENCOUNTER — Encounter: Payer: 59 | Admitting: Physical Therapy

## 2020-12-06 ENCOUNTER — Encounter: Payer: 59 | Admitting: Physical Therapy

## 2020-12-08 ENCOUNTER — Encounter: Payer: 59 | Admitting: Physical Therapy

## 2022-02-13 IMAGING — MR MR HEAD WO/W CM
13 series · 48 of 48 positions shown · IV contrast (gadavist)
Comparison: None.

CLINICAL DATA: Sudden onset of right foot not being able to relax
or unflex.

EXAM:
MRI HEAD WITHOUT AND WITH CONTRAST
TECHNIQUE: Multiplanar, multiecho pulse sequences of the brain and surrounding
structures were obtained without and with intravenous contrast.
CONTRAST:  8mL GADAVIST GADOBUTROL 1 MMOL/ML IV SOLN

[Series 5: ax dwi_tracew · axial · 3.0mm · 0.65mm/px · z∈[-31,+124]mm · 2 of 48 slices shown]
[im 1/48]
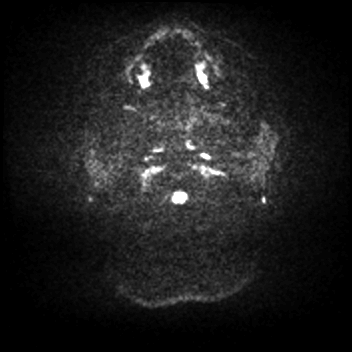
[im 48/48]
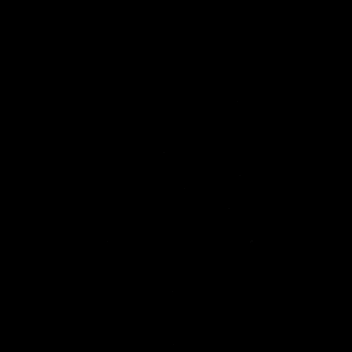

[Series 6: ax dwi_adc · axial · 3.0mm · 0.65mm/px · z∈[-31,+114]mm · 3 of 45 slices shown]
[im 1/45]
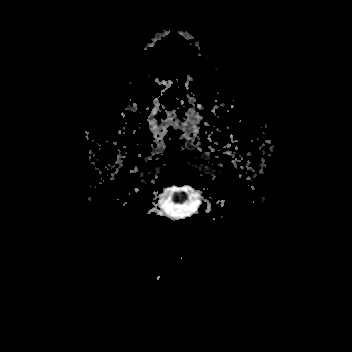
[im 23/45]
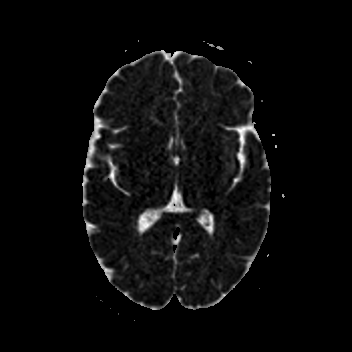
[im 45/45]
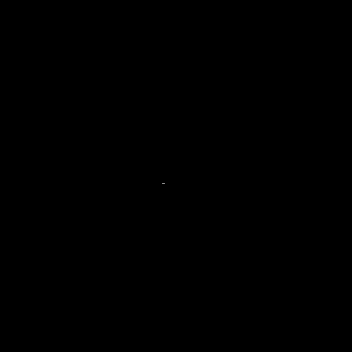

[Series 7: cor dwi_tracew · coronal · 5.0mm · 0.65mm/px · 2 of 40 slices shown]
[im 1/40]
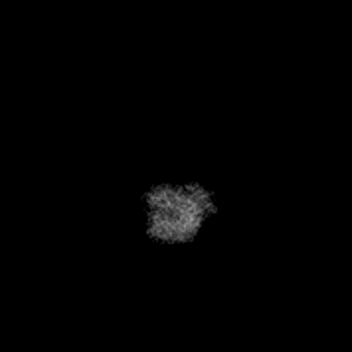
[im 40/40]
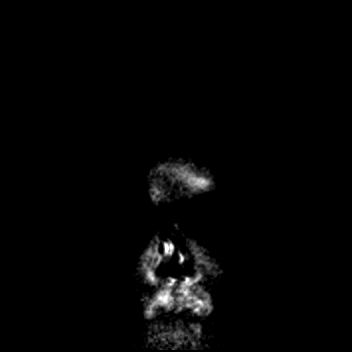

[Series 8: cor dwi_adc · coronal · 5.0mm · 0.65mm/px · 2 of 38 slices shown]
[im 1/38]
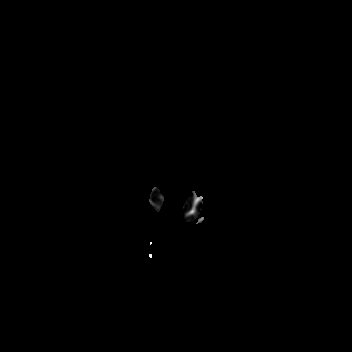
[im 38/38]
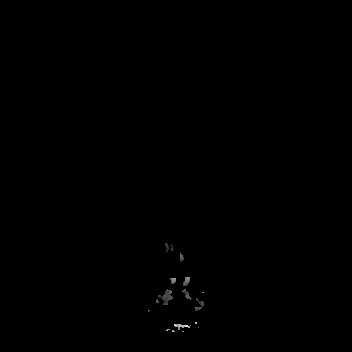

[Series 9: T1 · sagittal · 5.0mm · 0.62mm/px · 2 of 25 slices shown (1 of 2)]
[im 1/25]
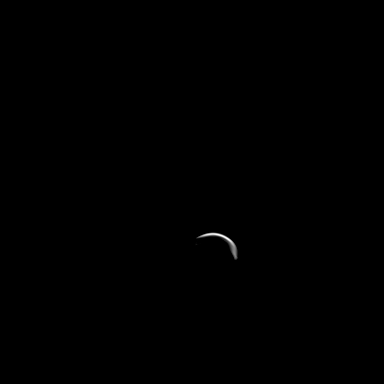
[im 25/25]
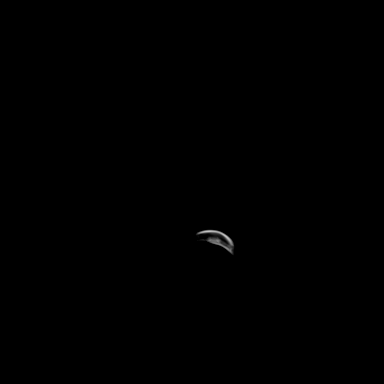

[Series 10: T2 · axial · 5.0mm · 0.53mm/px · z∈[-28,+122]mm · 2 of 26 slices shown]
[im 1/26]
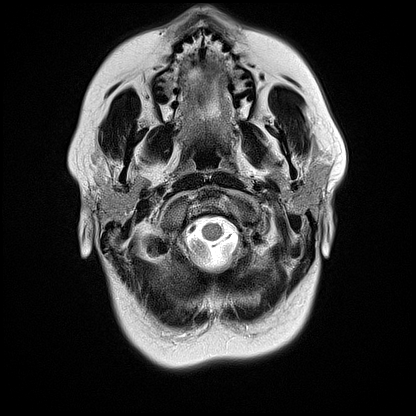
[im 26/26]
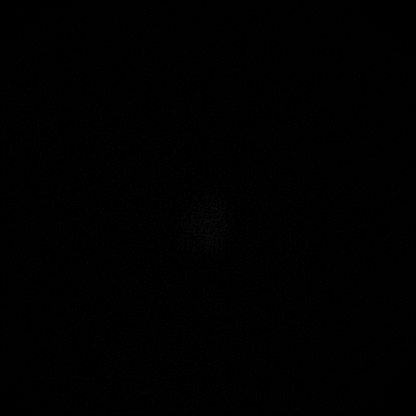

[Series 12: pha_images · axial · 3.0mm · 0.90mm/px · z∈[-42,+132]mm · 4 of 59 slices shown]
[im 1/59]
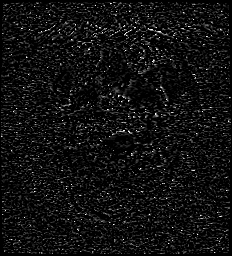
[im 20/59]
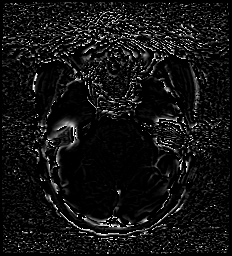
[im 39/59]
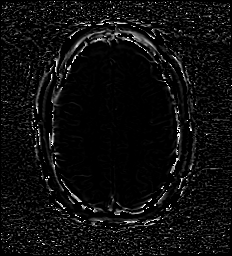
[im 59/59]
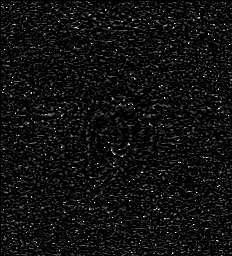

[Series 13: swi_images · axial · 3.0mm · 0.90mm/px · z∈[-42,+135]mm · 4 of 60 slices shown]
[im 1/60]
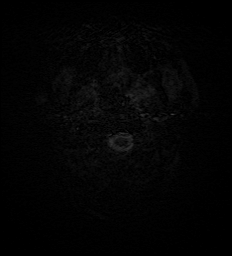
[im 20/60]
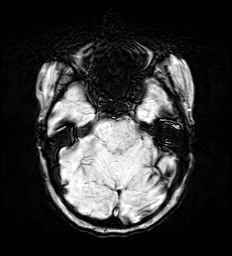
[im 40/60]
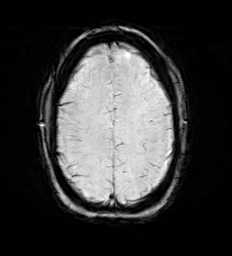
[im 60/60]
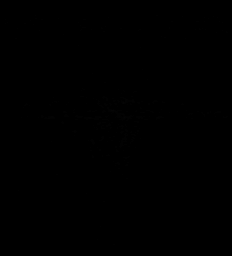

[Series 15: FLAIR · axial · 3.0mm · 0.53mm/px · z∈[-34,+128]mm · 3 of 55 slices shown]
[im 1/55]
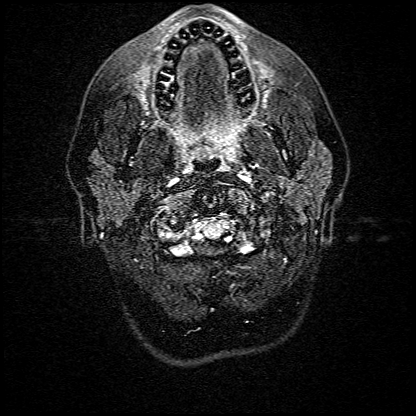
[im 28/55]
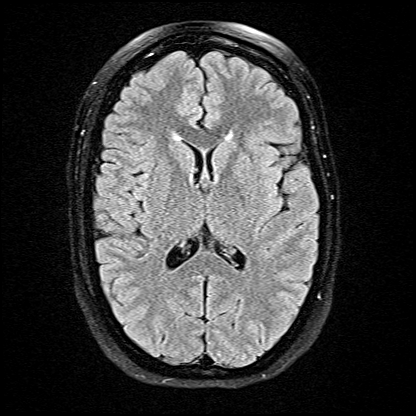
[im 55/55]
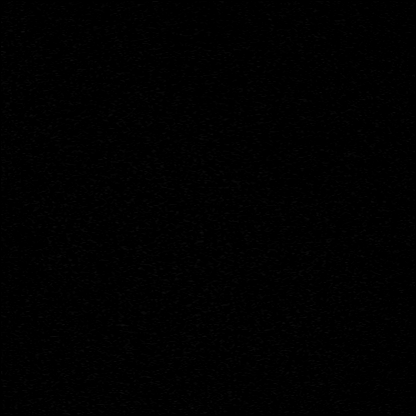

[Series 16: T1 · axial · 1.0mm · 0.98mm/px · z∈[-41,+130]mm · 10 of 168 slices shown (2 of 2)]
[im 1/168]
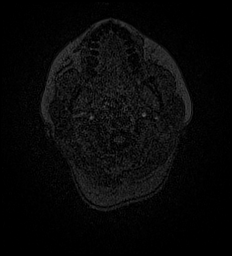
[im 19/168]
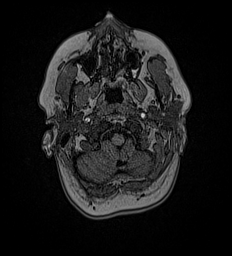
[im 38/168]
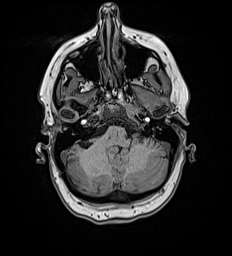
[im 56/168]
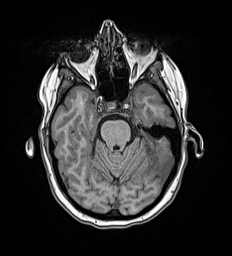
[im 75/168]
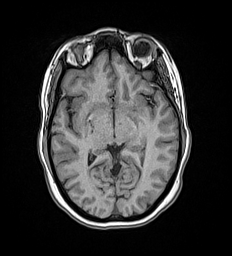
[im 93/168]
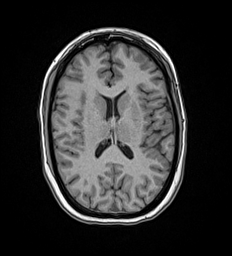
[im 112/168]
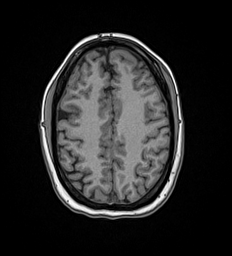
[im 130/168]
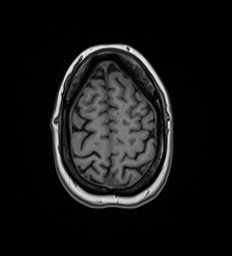
[im 149/168]
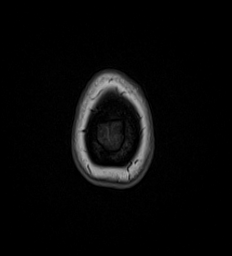
[im 168/168]
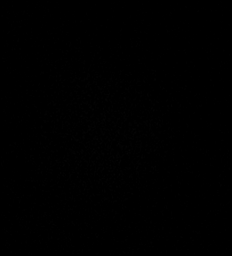

[Series 17: T2 post-contrast · coronal · 5.0mm · 0.57mm/px · 2 of 29 slices shown]
[im 1/29]
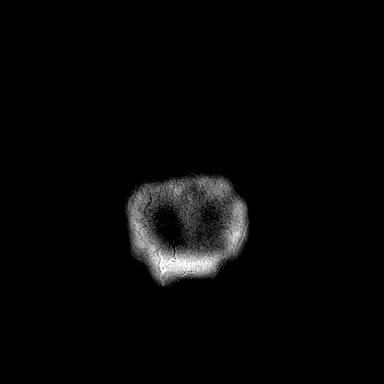
[im 29/29]
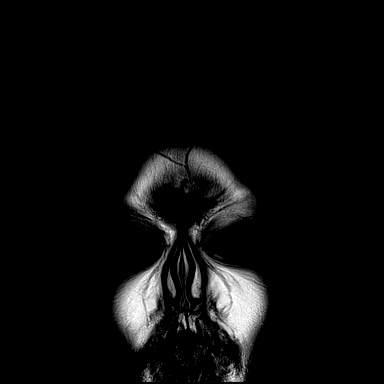

[Series 18: T1 post-contrast · axial · 1.0mm · 0.98mm/px · z∈[-41,+134]mm · 10 of 173 slices shown (1 of 2)]
[im 1/173]
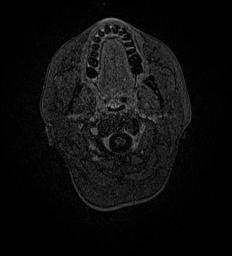
[im 20/173]
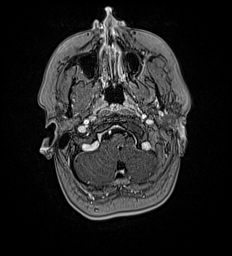
[im 39/173]
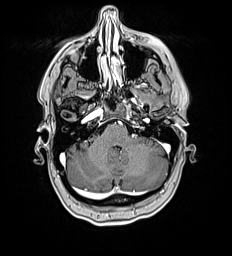
[im 58/173]
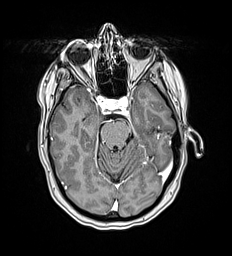
[im 77/173]
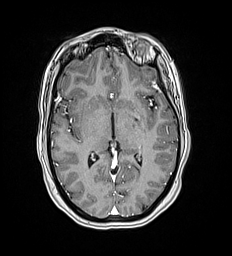
[im 96/173]
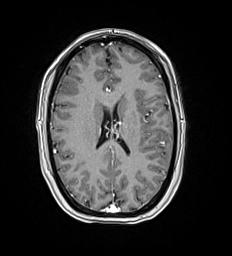
[im 115/173]
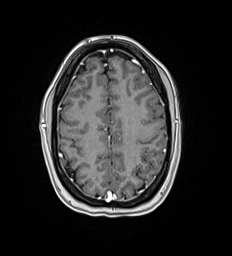
[im 134/173]
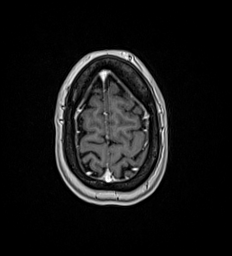
[im 153/173]
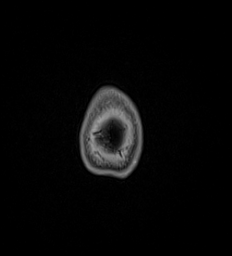
[im 173/173]
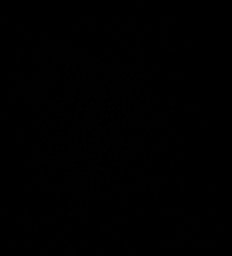

[Series 19: T1 post-contrast · coronal · 5.0mm · 0.57mm/px · 2 of 29 slices shown (2 of 2)]
[im 1/29]
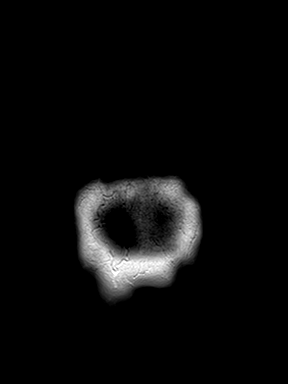
[im 29/29]
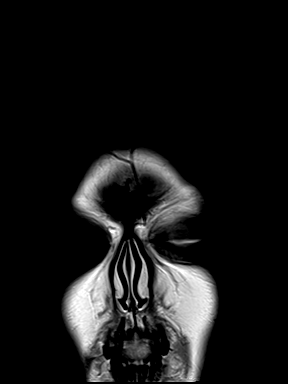

[48 of 48 positions shown; findings below may reference images not displayed]

FINDINGS: Brain: No acute infarction, hemorrhage, hydrocephalus, extra-axial
collection or mass lesion. No abnormal enhancement.

Vascular: Major arterial flow voids are maintained at the skull
base.

Skull and upper cervical spine: Normal marrow signal.

Sinuses/Orbits: Clear sinuses. Strabismus. Otherwise, unremarkable
orbits.

Other: Mildly prominent soft tissue in the posterior nasopharynx,
likely adenoid hypertrophy.
IMPRESSION: No acute intracranial abnormality.

## 8386-12-10 DEATH — deceased
